# Patient Record
Sex: Male | Born: 1990 | Hispanic: No | Marital: Married | Smoking: Never smoker
Health system: Southern US, Community
[De-identification: ages and names within clinical notes are randomized; demographics above are authoritative.]

## PROBLEM LIST (undated history)

## (undated) DIAGNOSIS — K219 Gastro-esophageal reflux disease without esophagitis: Secondary | ICD-10-CM

## (undated) HISTORY — PX: OTHER SURGICAL HISTORY: SHX169

---

## 2021-04-23 ENCOUNTER — Encounter (HOSPITAL_COMMUNITY): Payer: Self-pay

## 2021-04-23 ENCOUNTER — Emergency Department (HOSPITAL_COMMUNITY)
Admission: EM | Admit: 2021-04-23 | Discharge: 2021-04-23 | Disposition: A | Discharge status: Home / Self Care | Payer: Self-pay | Attending: Emergency Medicine | Admitting: Emergency Medicine

## 2021-04-23 ENCOUNTER — Emergency Department (HOSPITAL_COMMUNITY): Payer: Self-pay

## 2021-04-23 DIAGNOSIS — R1013 Epigastric pain: Secondary | ICD-10-CM | POA: Insufficient documentation

## 2021-04-23 DIAGNOSIS — M549 Dorsalgia, unspecified: Secondary | ICD-10-CM | POA: Insufficient documentation

## 2021-04-23 DIAGNOSIS — K219 Gastro-esophageal reflux disease without esophagitis: Secondary | ICD-10-CM | POA: Insufficient documentation

## 2021-04-23 DIAGNOSIS — R079 Chest pain, unspecified: Secondary | ICD-10-CM

## 2021-04-23 DIAGNOSIS — R0789 Other chest pain: Secondary | ICD-10-CM | POA: Insufficient documentation

## 2021-04-23 HISTORY — DX: Gastro-esophageal reflux disease without esophagitis: K21.9

## 2021-04-23 LAB — CBC
HCT: 47.6 % (ref 39.0–52.0)
Hemoglobin: 15.5 g/dL (ref 13.0–17.0)
MCH: 25.4 pg — ABNORMAL LOW (ref 26.0–34.0)
MCHC: 32.6 g/dL (ref 30.0–36.0)
MCV: 77.9 fL — ABNORMAL LOW (ref 80.0–100.0)
Platelets: 198 10*3/uL (ref 150–400)
RBC: 6.11 MIL/uL — ABNORMAL HIGH (ref 4.22–5.81)
RDW: 14.8 % (ref 11.5–15.5)
WBC: 3.5 10*3/uL — ABNORMAL LOW (ref 4.0–10.5)
nRBC: 0 % (ref 0.0–0.2)

## 2021-04-23 LAB — COMPREHENSIVE METABOLIC PANEL
ALT: 12 U/L (ref 0–44)
AST: 14 U/L — ABNORMAL LOW (ref 15–41)
Albumin: 4.1 g/dL (ref 3.5–5.0)
Alkaline Phosphatase: 61 U/L (ref 38–126)
Anion gap: 6 (ref 5–15)
BUN: 6 mg/dL (ref 6–20)
CO2: 26 mmol/L (ref 22–32)
Calcium: 9 mg/dL (ref 8.9–10.3)
Chloride: 107 mmol/L (ref 98–111)
Creatinine, Ser: 0.62 mg/dL (ref 0.61–1.24)
GFR, Estimated: >60 mL/min (ref >60)
Glucose, Bld: 87 mg/dL (ref 70–99)
Potassium: 3.9 mmol/L (ref 3.5–5.1)
Sodium: 139 mmol/L (ref 135–145)
Total Bilirubin: 0.8 mg/dL (ref 0.3–1.2)
Total Protein: 6.5 g/dL (ref 6.5–8.1)

## 2021-04-23 LAB — TROPONIN I (HIGH SENSITIVITY)
Troponin I (High Sensitivity): <2 ng/L (ref <18)
Troponin I (High Sensitivity): <2 ng/L (ref <18)

## 2021-04-23 LAB — LIPASE, BLOOD: Lipase: 30 U/L (ref 11–51)

## 2021-04-23 MED ORDER — NAPROXEN 500 MG PO TABS: 500.0000 mg | ORAL_TABLET | Freq: Two times a day (BID) | ORAL | 0 refills | Status: AC

## 2021-04-23 MED ORDER — ALBUTEROL SULFATE HFA 108 (90 BASE) MCG/ACT IN AERS: 1.0000 | INHALATION_SPRAY | Freq: Four times a day (QID) | RESPIRATORY_TRACT | 0 refills | Status: AC | PRN

## 2021-04-23 MED ORDER — ALUM & MAG HYDROXIDE-SIMETH 200-200-20 MG/5ML PO SUSP
30.0000 mL | Freq: Once | ORAL | Status: AC
  Administered 2021-04-23: 30 mL via ORAL
  Filled 2021-04-23: qty 30

## 2021-04-23 MED ORDER — LIDOCAINE 5 % EX PTCH
1.0000 | MEDICATED_PATCH | CUTANEOUS | Status: DC
  Administered 2021-04-23: 1 via TRANSDERMAL
  Filled 2021-04-23: qty 1

## 2021-04-23 MED ORDER — PANTOPRAZOLE SODIUM 20 MG PO TBEC: 20.0000 mg | DELAYED_RELEASE_TABLET | Freq: Every day | ORAL | 0 refills | Status: AC

## 2021-04-23 MED ORDER — LIDOCAINE VISCOUS HCL 2 % MT SOLN
15.0000 mL | Freq: Once | OROMUCOSAL | Status: AC
  Administered 2021-04-23: 15 mL via ORAL
  Filled 2021-04-23: qty 15

## 2021-04-23 MED ORDER — FAMOTIDINE 20 MG PO TABS
20.0000 mg | ORAL_TABLET | Freq: Once | ORAL | Status: AC
  Administered 2021-04-23: 20 mg via ORAL
  Filled 2021-04-23: qty 1

## 2021-04-23 NOTE — Discharge Instructions (Addendum)
It was a pleasure meeting you today.  If your symptoms worsen, you become short of breath please be reevaluated in the emergency department.  I recommend you establish with a primary care provider so that he can follow-up with them regarding this issue.  I hope you have a wonderful afternoon.  I am sending you home with pain medication and reflux medication. Take the reflux daily and take pain medication as needed.

## 2021-04-23 NOTE — ED Provider Notes (Signed)
[facility] EMERGENCY DEPARTMENT Provider Note   CSN: [id number] Arrival date & time: 04/23/21  1330     History Chief Complaint  Patient presents with  . Chest Pain    Joe Kim is a 30 y.o. male.  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  He reports he has been having right-sided chest pain for 2 days.  He reports history of issues with his stomach which he takes medication for but he does not know the name of the medication he did not bring any of his medications with him.  Reports chest pain is like a pressure and does not radiate anywhere.  Denies any shortness of breath.  He is also complaining mid back pain which is tender to palpation.  He has not been taking any new medications for the pain that he is having.  Patient reports he has epigastric pain and that his worst symptom is acid reflux right now.  Has not had any swelling in his lower extremities.  Denies any nausea, vomiting, diarrhea, constipation, headaches, change in vision, fever, chills, cough, congestion.      Past Medical History:  Diagnosis Date  . Acid reflux     There are no problems to display for this patient.   Past Surgical History:  Procedure Laterality Date  . hemroid surgery         History reviewed. No pertinent family history.  Social History   Tobacco Use  . Smoking status: Never  . Smokeless tobacco: Never  Vaping Use  . Vaping Use: Never used  Substance Use Topics  . Alcohol use: Never  . Drug use: Yes    Types: Marijuana    Comment: hasheshe    Home Medications Prior to Admission medications   Not on File    Allergies    Patient has no known allergies.  Review of Systems   Review of Systems  Constitutional:  Negative for chills and fever.  HENT:  Negative for congestion and rhinorrhea.   Eyes:  Negative for visual disturbance.  Respiratory:  Negative for cough and shortness of breath.   Cardiovascular:  Positive for chest  pain.  Gastrointestinal:  Positive for abdominal pain. Negative for constipation, diarrhea, nausea and vomiting.  Musculoskeletal:  Negative for joint swelling and neck stiffness.  Neurological:  Negative for headaches.   Physical Exam Updated Vital Signs BP 102/73   Pulse (!) 55   Temp 98 F (36.7 C) (Oral)   Resp 19   Ht 5\' 7"  (1.702 m)   Wt 43.4 kg   SpO2 100%   BMI 14.99 kg/m   Physical Exam Vitals reviewed.  Constitutional:      General: He is not in acute distress.    Appearance: He is not ill-appearing.  HENT:     Head: Normocephalic and atraumatic.     Right Ear: External ear normal.     Left Ear: External ear normal.     Nose: Nose normal. No congestion.  Eyes:     Extraocular Movements: Extraocular movements intact.     Pupils: Pupils are equal, round, and reactive to light.  Cardiovascular:     Rate and Rhythm: Normal rate and regular rhythm.     Pulses: Normal pulses.     Heart sounds: Normal heart sounds.  Pulmonary:     Effort: Pulmonary effort is normal.     Breath sounds: Normal breath sounds.  Abdominal:     General: Abdomen  is flat. Bowel sounds are normal.     Palpations: Abdomen is soft.  Musculoskeletal:        General: Tenderness (Muscular tenderness just to the right of right scapula.) present.     Cervical back: Normal range of motion and neck supple.  Skin:    General: Skin is warm.     Capillary Refill: Capillary refill takes less than 2 seconds.  Neurological:     General: No focal deficit present.     Mental Status: He is alert.    ED Results / Procedures / Treatments   Labs (all labs ordered are listed, but only abnormal results are displayed) Labs Reviewed  CBC - Abnormal; Notable for the following components:      Result Value   WBC 3.5 (*)    RBC 6.11 (*)    MCV 77.9 (*)    MCH 25.4 (*)    All other components within normal limits  COMPREHENSIVE METABOLIC PANEL  TROPONIN I (HIGH SENSITIVITY)  TROPONIN I (HIGH  SENSITIVITY)    EKG EKG Interpretation  Date/Time:  Tuesday April 23 2021 13:39:06 EDT Ventricular Rate:  65 PR Interval:  132 QRS Duration: 80 QT Interval:  383 QTC Calculation: 399 R Axis:   62 Text Interpretation: Sinus rhythm Atrial premature complex RSR' in V1 or V2, probably normal variant ST elevation suggests acute pericarditis Confirmed by Blane Ohara [phone]) on 04/23/2021 2:16:39 PM  Radiology DG Chest Portable 1 View  Result Date: 04/23/2021 CLINICAL DATA:  30 year old male with history of chest pain for the past 2 days. EXAM: PORTABLE CHEST 1 VIEW COMPARISON:  No priors. FINDINGS: Lung volumes are normal. No consolidative airspace disease. No pleural effusions. No pneumothorax. No pulmonary nodule or mass noted. Pulmonary vasculature and the cardiomediastinal silhouette are within normal limits. IMPRESSION: No radiographic evidence of acute cardiopulmonary disease. Electronically Signed   By: Trudie Reed M.D.   On: 04/23/2021 14:11    Procedures Procedures   Medications Ordered in ED Medications  famotidine (PEPCID) tablet 20 mg (20 mg Oral Given 04/23/21 1458)    ED Course  I have reviewed the triage vital signs and the nursing notes.  Pertinent labs & imaging results that were available during my care of the patient were reviewed by me and considered in my medical decision making (see chart for details).    MDM Rules/Calculators/A&P                          Patient presented with 2-day history of right-sided chest pain.  He is also complaining of epigastric pain.  On arrival blood pressure was normal and heart rate was bradycardic.  EKG showed atrial premature complexes as well as signs concerning for pericarditis.  Chest x-ray showed no evidence of acute cardiopulmonary disease.  CBC showed no elevated white count and hemoglobin of 15.5. Etiology unclear at this time and I feel language barrier is an issue.  Differential includes pericarditis versus  costochondritis versus GERD.  Patient given Pepcid to help with possible reflux.  At time of shift change CMP was pending.  Troponin is also pending.  We will wait for these to come back but plan on treating with NSAIDs and reflux medications.  Most likely to discharge after lab results come back. Final Clinical Impression(s) / ED Diagnoses Final diagnoses:  Chest pain, unspecified type    Rx / DC Orders ED Discharge Orders     None  Derrel Nip, MD 04/23/21 [id number]    Blane Ohara, MD 04/25/21 [phone]

## 2021-04-23 NOTE — ED Provider Notes (Signed)
Care assumed from Dr. Nobie Putnam at shift change pending labs.  See his note for full HPI.  In short, patient is a 30 year old male who presents to the ED due to chest pain x2 days. Patient states chest pain is located throughout entire anterior chest wall. Chest pain is a pressure-like sensation and is nonradiating in nature.  No associated shortness of breath, nausea, or vomiting.  Patient is also endorsing epigastric pain.  History of GERD.  No cardiac history.  No tobacco use.  At shift change, added lipase due to epigastric tenderness to rule out pancreatitis  Plan from previous provider: if labs are unremarkable, patient may be discharged home with a reflux medication and PCP follow-up. Physical Exam  BP 112/81   Pulse (!) 58   Temp 98 F (36.7 C) (Oral)   Resp (!) 22   Ht 5\' 7"  (1.702 m)   Wt 43.4 kg   SpO2 100%   BMI 14.99 kg/m   Physical Exam Vitals and nursing note reviewed.  Constitutional:      General: He is not in acute distress.    Appearance: He is not ill-appearing.  HENT:     Head: Normocephalic.  Eyes:     Pupils: Pupils are equal, round, and reactive to light.  Cardiovascular:     Rate and Rhythm: Normal rate and regular rhythm.     Pulses: Normal pulses.     Heart sounds: Normal heart sounds. No murmur heard.   No friction rub. No gallop.  Pulmonary:     Effort: Pulmonary effort is normal.     Breath sounds: Normal breath sounds.  Abdominal:     General: Abdomen is flat. There is no distension.     Palpations: Abdomen is soft.     Tenderness: There is abdominal tenderness. There is no guarding or rebound.     Comments: Mild epigastric tenderness.  Musculoskeletal:        General: Normal range of motion.     Cervical back: Neck supple.     Comments: No lower extremity edema. Negative homan sign bilaterally  Skin:    General: Skin is warm and dry.  Neurological:     General: No focal deficit present.     Mental Status: He is alert.  Psychiatric:         Mood and Affect: Mood normal.        Behavior: Behavior normal.    ED Course/Procedures    Results for orders placed or performed during the hospital encounter of 04/23/21 (from the past 24 hour(s))  Troponin I (High Sensitivity)     Status: None   Collection Time: 04/23/21  2:51 PM  Result Value Ref Range   Troponin I (High Sensitivity) <2 <18 ng/L  CBC     Status: Abnormal   Collection Time: 04/23/21  2:51 PM  Result Value Ref Range   WBC 3.5 (L) 4.0 - 10.5 K/uL   RBC 6.11 (H) 4.22 - 5.81 MIL/uL   Hemoglobin 15.5 13.0 - 17.0 g/dL   HCT 04/25/21 40.9 - 81.1 %   MCV 77.9 (L) 80.0 - 100.0 fL   MCH 25.4 (L) 26.0 - 34.0 pg   MCHC 32.6 30.0 - 36.0 g/dL   RDW 91.4 78.2 - 95.6 %   Platelets 198 150 - 400 K/uL   nRBC 0.0 0.0 - 0.2 %  Comprehensive metabolic panel     Status: Abnormal   Collection Time: 04/23/21  2:51 PM  Result Value Ref  Range   Sodium 139 135 - 145 mmol/L   Potassium 3.9 3.5 - 5.1 mmol/L   Chloride 107 98 - 111 mmol/L   CO2 26 22 - 32 mmol/L   Glucose, Bld 87 70 - 99 mg/dL   BUN 6 6 - 20 mg/dL   Creatinine, Ser 7.62 0.61 - 1.24 mg/dL   Calcium 9.0 8.9 - 83.1 mg/dL   Total Protein 6.5 6.5 - 8.1 g/dL   Albumin 4.1 3.5 - 5.0 g/dL   AST 14 (L) 15 - 41 U/L   ALT 12 0 - 44 U/L   Alkaline Phosphatase 61 38 - 126 U/L   Total Bilirubin 0.8 0.3 - 1.2 mg/dL   GFR, Estimated >51 >76 mL/min   Anion gap 6 5 - 15  Lipase, blood     Status: None   Collection Time: 04/23/21  4:20 PM  Result Value Ref Range   Lipase 30 11 - 51 U/L    Procedures  MDM  Care assumed from Dr. Nobie Putnam at shift change pending labs.  See his note for full MDM.  30 year old male presents to the ED due chest pain x2 days.  No association with exertion or positional changes.  No pleuritic nature.  Upon arrival, stable vitals.  Patient is afebrile, not tachycardic or hypoxic.  Patient nontoxic-appearing.  Physical exam significant for mild epigastric tenderness without rebound or guarding.  Mild  anterior chest wall tenderness without crepitus or deformity. Patient given pepcid by previous provider which he admits to moderate relief in symptoms.   Chest x-ray negative for signs of pneumonia, pneumothorax marijuana mediastinum.  EKG demonstrates normal sinus rhythm with nonspecific ST-T wave abnormalities likely due to early repolarization.  CBC significant for leukopenia at 3.5.  Normal hemoglobin.  Lipase normal at 30.  Doubt pancreatitis.  CMP reassuring with normal renal function no major electrolyte derangements.  Delta troponin flat. Heart score: 1. Low suspicion for ACS.  PERC negative and low risk using Wells criteria.  Doubt PE/DVT.  Dissection was considered but thought to be less likely given presentation. Suspect symptoms related to GERV vs. MSK etiology given reproducible nature on exam. Patient discharged with naproxen and protonix. Advised patient to follow-up with PCP within the next week for further evaluation. Strict ED precautions discussed with patient. Patient states understanding and agrees to plan. Patient discharged home in no acute distress and stable vitals.  Previous provider discussed with attending, Dr. Jodi Mourning, who agrees with assessment and plan.         Mannie Stabile, PA-C 04/23/21 1723    Blane Ohara, MD 04/25/21 [phone]

## 2021-04-23 NOTE — ED Notes (Signed)
Pt. Ambulated to restroom with steady gait.  

## 2021-04-23 NOTE — ED Triage Notes (Signed)
Pt. Arrives from home via EMS. Pt states they have had chest pain x2 days.  Pt. States they feel nauseas. Pt. Was given 324 mg of aspirin in route and 0.4 mg of nitro. Pt. States they feel tired, lethargic and faint at times.

## 2022-05-23 IMAGING — DX DG CHEST 1V PORT
1 series · 1 of 1 positions shown · non-contrast
Comparison: No priors.

CLINICAL DATA: 30-year-old male with history of chest pain for the
past 2 days.

EXAM:
PORTABLE CHEST 1 VIEW

[chest ap]
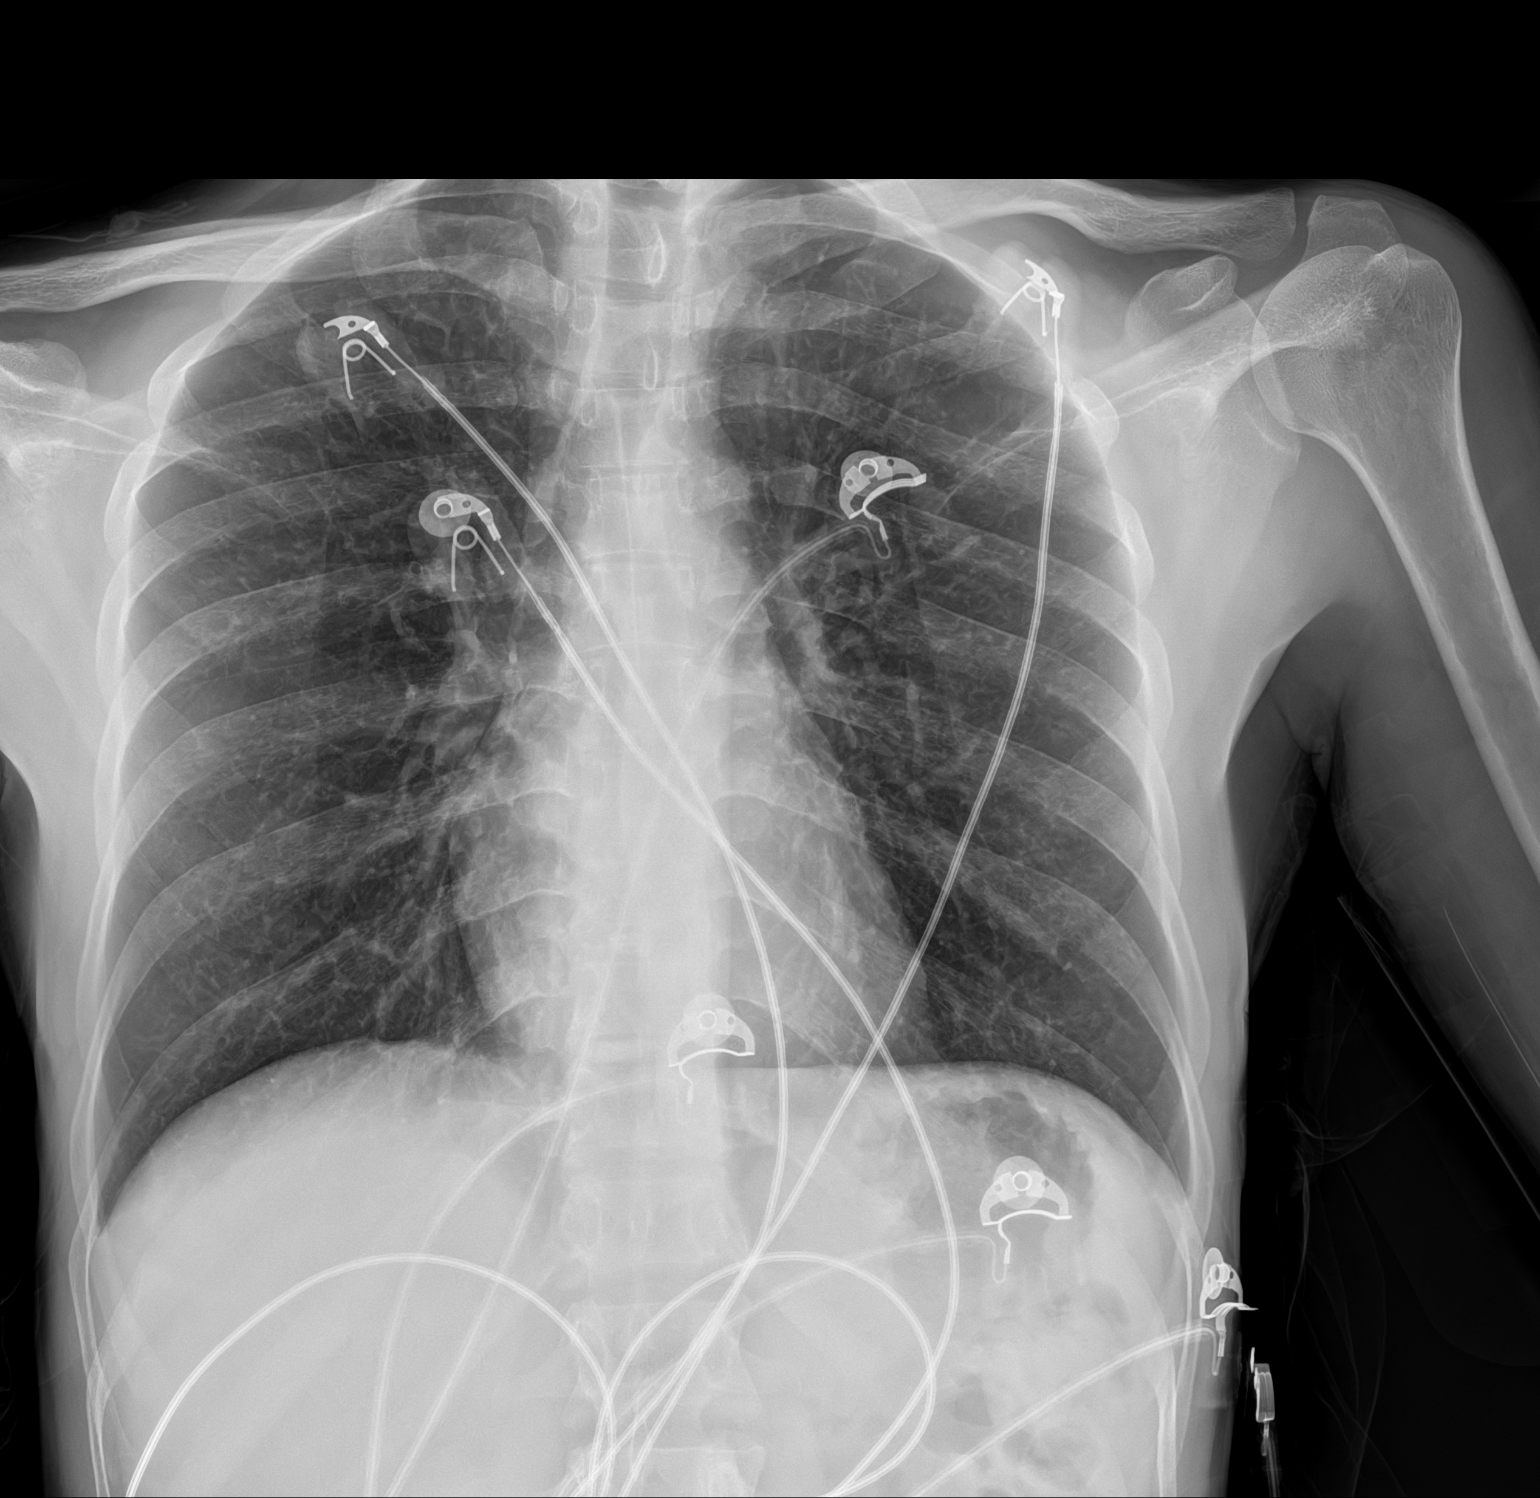

[1 of 1 positions shown; findings below may reference images not displayed]

FINDINGS: Lung volumes are normal. No consolidative airspace disease. No
pleural effusions. No pneumothorax. No pulmonary nodule or mass
noted. Pulmonary vasculature and the cardiomediastinal silhouette
are within normal limits.
IMPRESSION: No radiographic evidence of acute cardiopulmonary disease.
